# Patient Record
Sex: Female | Born: 1965 | Race: White | Hispanic: No | Marital: Married | Smoking: Never smoker
Health system: Southern US, Community
[De-identification: ages and names within clinical notes are randomized; demographics above are authoritative.]

## PROBLEM LIST (undated history)

## (undated) DIAGNOSIS — F32A Depression, unspecified: Secondary | ICD-10-CM

## (undated) DIAGNOSIS — F329 Major depressive disorder, single episode, unspecified: Secondary | ICD-10-CM

## (undated) HISTORY — PX: ABDOMINAL HYSTERECTOMY: SHX81

---

## 2014-05-15 ENCOUNTER — Emergency Department (HOSPITAL_BASED_OUTPATIENT_CLINIC_OR_DEPARTMENT_OTHER)
Admission: EM | Admit: 2014-05-15 | Discharge: 2014-05-16 | Disposition: A | Discharge status: Home / Self Care | Payer: 59 | Attending: Emergency Medicine | Admitting: Emergency Medicine

## 2014-05-15 ENCOUNTER — Encounter (HOSPITAL_BASED_OUTPATIENT_CLINIC_OR_DEPARTMENT_OTHER): Payer: Self-pay | Admitting: *Deleted

## 2014-05-15 ENCOUNTER — Emergency Department (HOSPITAL_BASED_OUTPATIENT_CLINIC_OR_DEPARTMENT_OTHER): Payer: 59

## 2014-05-15 DIAGNOSIS — R05 Cough: Secondary | ICD-10-CM | POA: Diagnosis present

## 2014-05-15 DIAGNOSIS — Z7952 Long term (current) use of systemic steroids: Secondary | ICD-10-CM | POA: Diagnosis not present

## 2014-05-15 DIAGNOSIS — J209 Acute bronchitis, unspecified: Secondary | ICD-10-CM | POA: Diagnosis not present

## 2014-05-15 DIAGNOSIS — Z8659 Personal history of other mental and behavioral disorders: Secondary | ICD-10-CM | POA: Insufficient documentation

## 2014-05-15 DIAGNOSIS — R059 Cough, unspecified: Secondary | ICD-10-CM

## 2014-05-15 DIAGNOSIS — Z79899 Other long term (current) drug therapy: Secondary | ICD-10-CM | POA: Diagnosis not present

## 2014-05-15 DIAGNOSIS — Z792 Long term (current) use of antibiotics: Secondary | ICD-10-CM | POA: Insufficient documentation

## 2014-05-15 HISTORY — DX: Major depressive disorder, single episode, unspecified: F32.9

## 2014-05-15 HISTORY — DX: Depression, unspecified: F32.A

## 2014-05-15 MED ORDER — AEROCHAMBER PLUS W/MASK MISC
1.0000 | Freq: Once | Status: AC
  Administered 2014-05-16: 1
  Filled 2014-05-15: qty 1

## 2014-05-15 NOTE — Discharge Instructions (Signed)

## 2014-05-15 NOTE — ED Provider Notes (Signed)
CSN: [id number]     Arrival date & time 05/15/14  2119 History  This chart was scribed for Hanley Seamen, MD by Roxy Cedar, ED Scribe. This patient was seen in room MH05/MH05 and the patient's care was started at 11:52 PM.   Chief Complaint  Patient presents with  . Cough   HPI  HPI Comments: Robin Love is a 49 y.o. female who presents to the Emergency Department complaining of cough that began a week ago with associated soreness in chest when she coughs. She was seen by a physician 7 days ago, and again 5 days ago, and has been treated with cefdinir, hydrocodone/homatropine, Tessalon Perles, prednisone and an albuterol inhaler. The patient reports continued severe coughing despite use of these medications. Her chest wall pain, located in the bilateral anterior lower ribs, is worse. She denies a fever. No x-ray was done prior to her visit here.  Past Medical History  Diagnosis Date  . Depression    Past Surgical History  Procedure Laterality Date  . Abdominal hysterectomy    . Cesarean section     History reviewed. No pertinent family history. History  Substance Use Topics  . Smoking status: Never Smoker   . Smokeless tobacco: Not on file  . Alcohol Use: No   OB History    No data available     Review of Systems  A complete 10 system review of systems was obtained and all systems are negative except as noted in the HPI and PMH.    Allergies  Review of patient's allergies indicates no active allergies.  Home Medications   Prior to Admission medications   Medication Sig Start Date End Date Taking? Authorizing Provider  albuterol (PROVENTIL HFA;VENTOLIN HFA) 108 (90 BASE) MCG/ACT inhaler Inhale into the lungs every 6 (six) hours as needed for wheezing or shortness of breath.   Yes Historical Provider, MD  cefdinir (OMNICEF) 300 MG capsule Take 300 mg by mouth 2 (two) times daily.   Yes Historical Provider, MD  HYDROcodone-homatropine (HYCODAN) 5-1.5 MG/5ML syrup Take 5  mLs by mouth every 6 (six) hours as needed for cough.   Yes Historical Provider, MD  predniSONE (DELTASONE) 10 MG tablet Take 10 mg by mouth daily with breakfast.   Yes Historical Provider, MD   Triage Vitals: BP 137/89 mmHg  Pulse 80  Temp(Src) 98.6 F (37 C) (Oral)  Resp 20  SpO2 98%  Physical Exam  General: Well-developed, well-nourished female in no acute distress; appearance consistent with age of record HENT: normocephalic; atraumatic Eyes: pupils equal, round and reactive to light; extraocular muscles intact Neck: supple Heart: regular rate and rhythm Lungs: faint inspiratory wheezing and decreased sounds in bases; cough with attempted deep breathing Abdomen: soft; nondistended; nontender; bowel sounds present Extremities: No deformity; full range of motion; pulses normal Neurologic: Awake, alert and oriented; motor function intact in all extremities and symmetric; no facial droop Skin: Warm and dry Psychiatric: Normal mood and affect  ED Course  Procedures (including critical care time)  DIAGNOSTIC STUDIES: Oxygen Saturation is 98% on RA, normal by my interpretation.    COORDINATION OF CARE: 11:58 PM- Advised patient to continue taking present medications but will have Respiratory Therapist giver her a spacer and teach her how to use her albuterol inhaler optimally. Discussed plans to discharge. Pt advised of plan for treatment and pt agrees.  MDM  Nursing notes and vitals signs, including pulse oximetry, reviewed.  Summary of this visit's results, reviewed by myself:  Imaging Studies: Dg Chest 2 View  05/15/2014   CLINICAL DATA:  Cough for 1 week, recent diagnosis of bronchitis, not improving with medication.  EXAM: CHEST  2 VIEW  COMPARISON:  Chest radiograph report December 14, 2005 though images are not available for direct comparison.  FINDINGS: The cardiac silhouette is upper limits of normal in size, mediastinal silhouette is nonsuspicious. LEFT lower lobe bandlike  consolidation best appreciate on the lateral radiograph. No pleural effusion. No pneumothorax. Soft tissue planes and included osseous structures are nonsuspicious.  IMPRESSION: Bandlike consolidation in the LEFT lower lobe favors atelectasis though underlying pneumonia not excluded. Recommend follow-up radiographs to verify improvement after treatment.  Borderline cardiomegaly.   Electronically Signed   By: Awilda Metroourtnay  Bloomer   On: 05/15/2014 22:07    Final diagnoses:  Acute bronchitis with bronchospasm    I personally performed the services described in this documentation, which was scribed in my presence. The recorded information has been reviewed and is accurate.  Hanley SeamenJohn L Linette Gunderson, MD 05/16/14 [phone]

## 2014-05-15 NOTE — ED Notes (Signed)
C/o cough onset last week  Was seen Friday and Sunday for same,  Has been on several meds w no improvement  Having bilateral side and back pain from cough

## 2014-05-15 NOTE — ED Notes (Signed)
Pt c/o DX bronchitis x 1 week ago given ABX/prednsione  pt c/o bil flank and chest pain from coughing , no xray done.

## 2014-05-16 NOTE — Progress Notes (Signed)
Rt instructed the patient in the correct use of a spacer with her MDI.  Patient demonstrated correct technique using a MDI with a spacer.

## 2016-03-16 IMAGING — CR DG CHEST 2V
2 series · 2 of 2 positions shown · non-contrast
Comparison: Chest radiograph report December 14, 2005 though images
are not available for direct comparison.

CLINICAL DATA: Cough for 1 week, recent diagnosis of bronchitis,
not improving with medication.

EXAM:
CHEST  2 VIEW

[w chest pa]
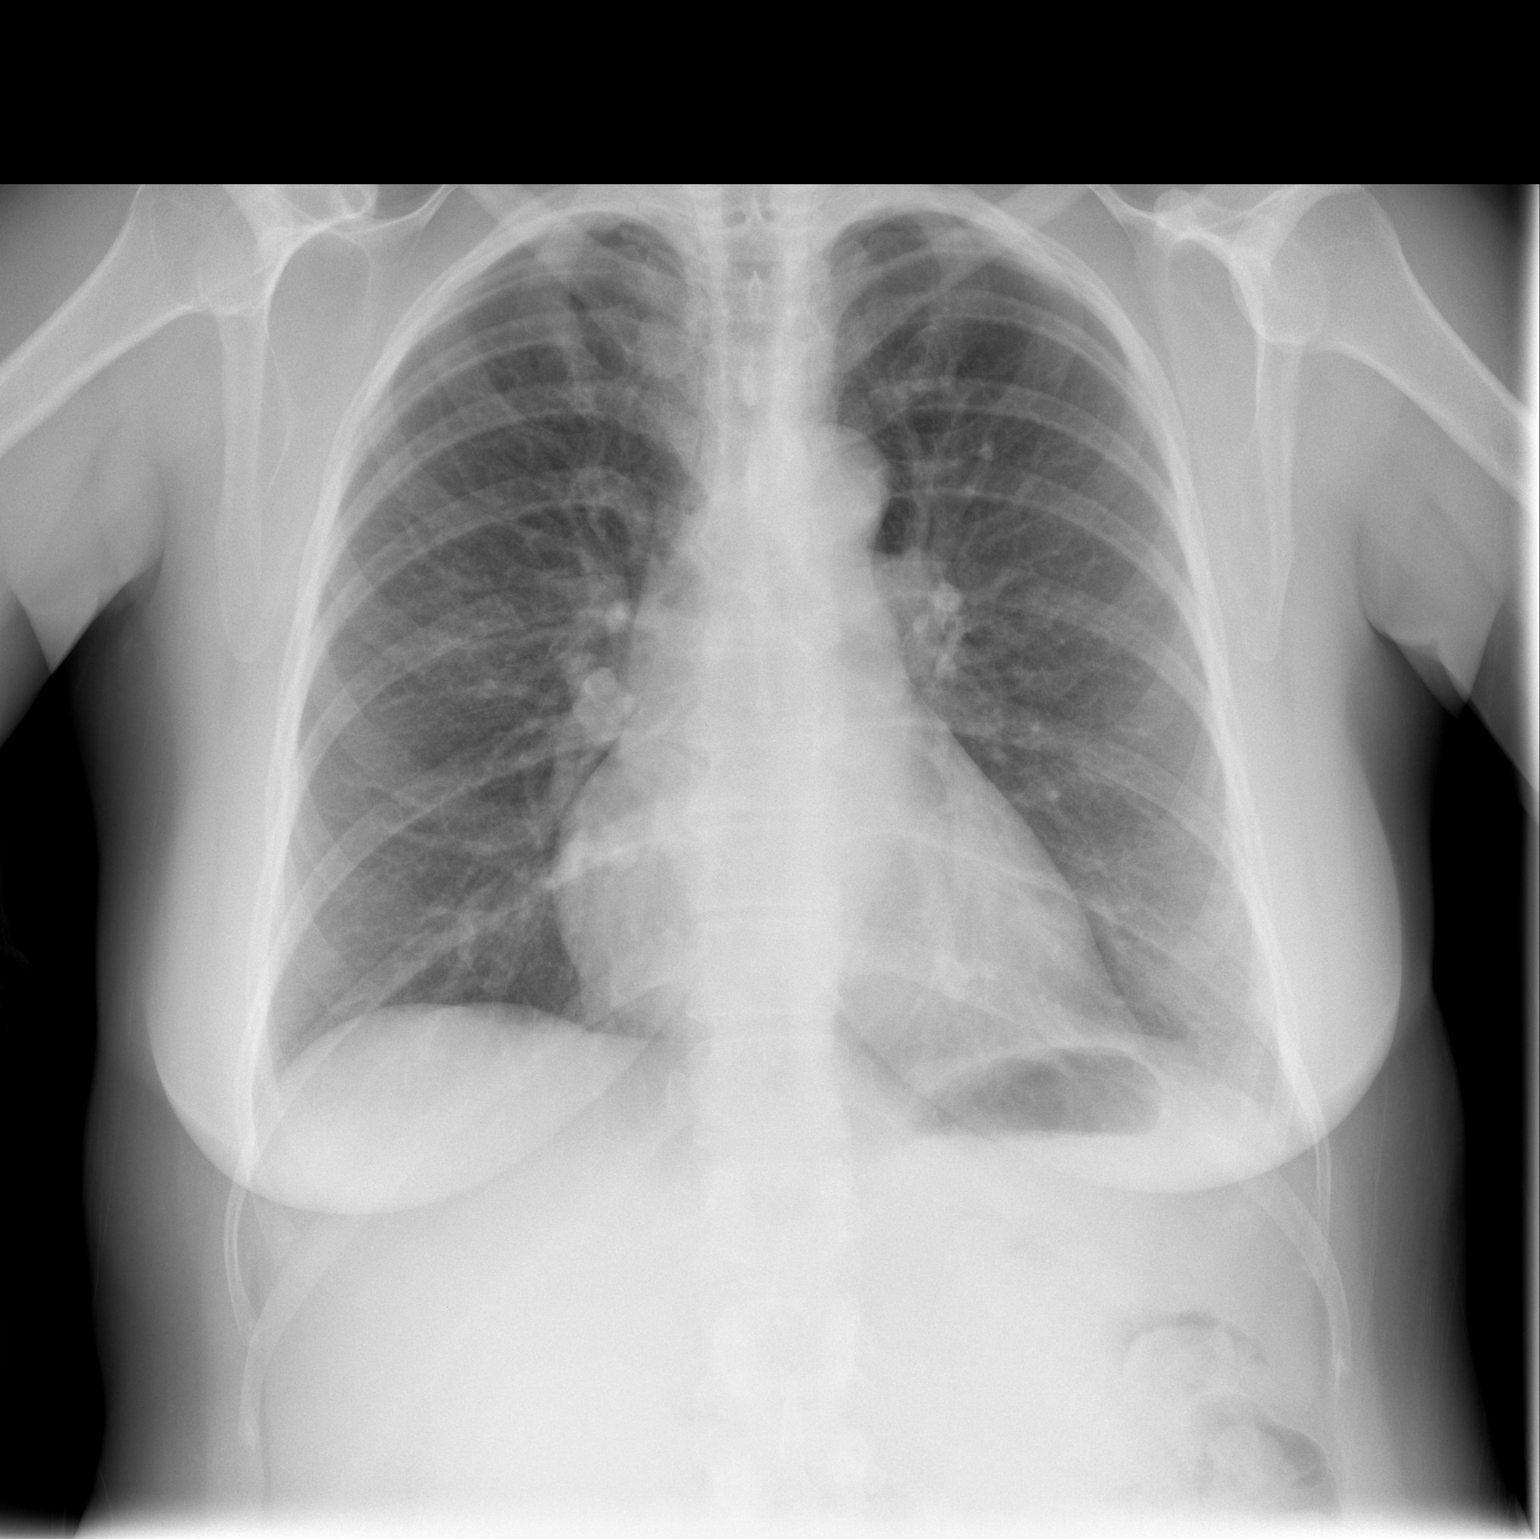

[w chest lat]
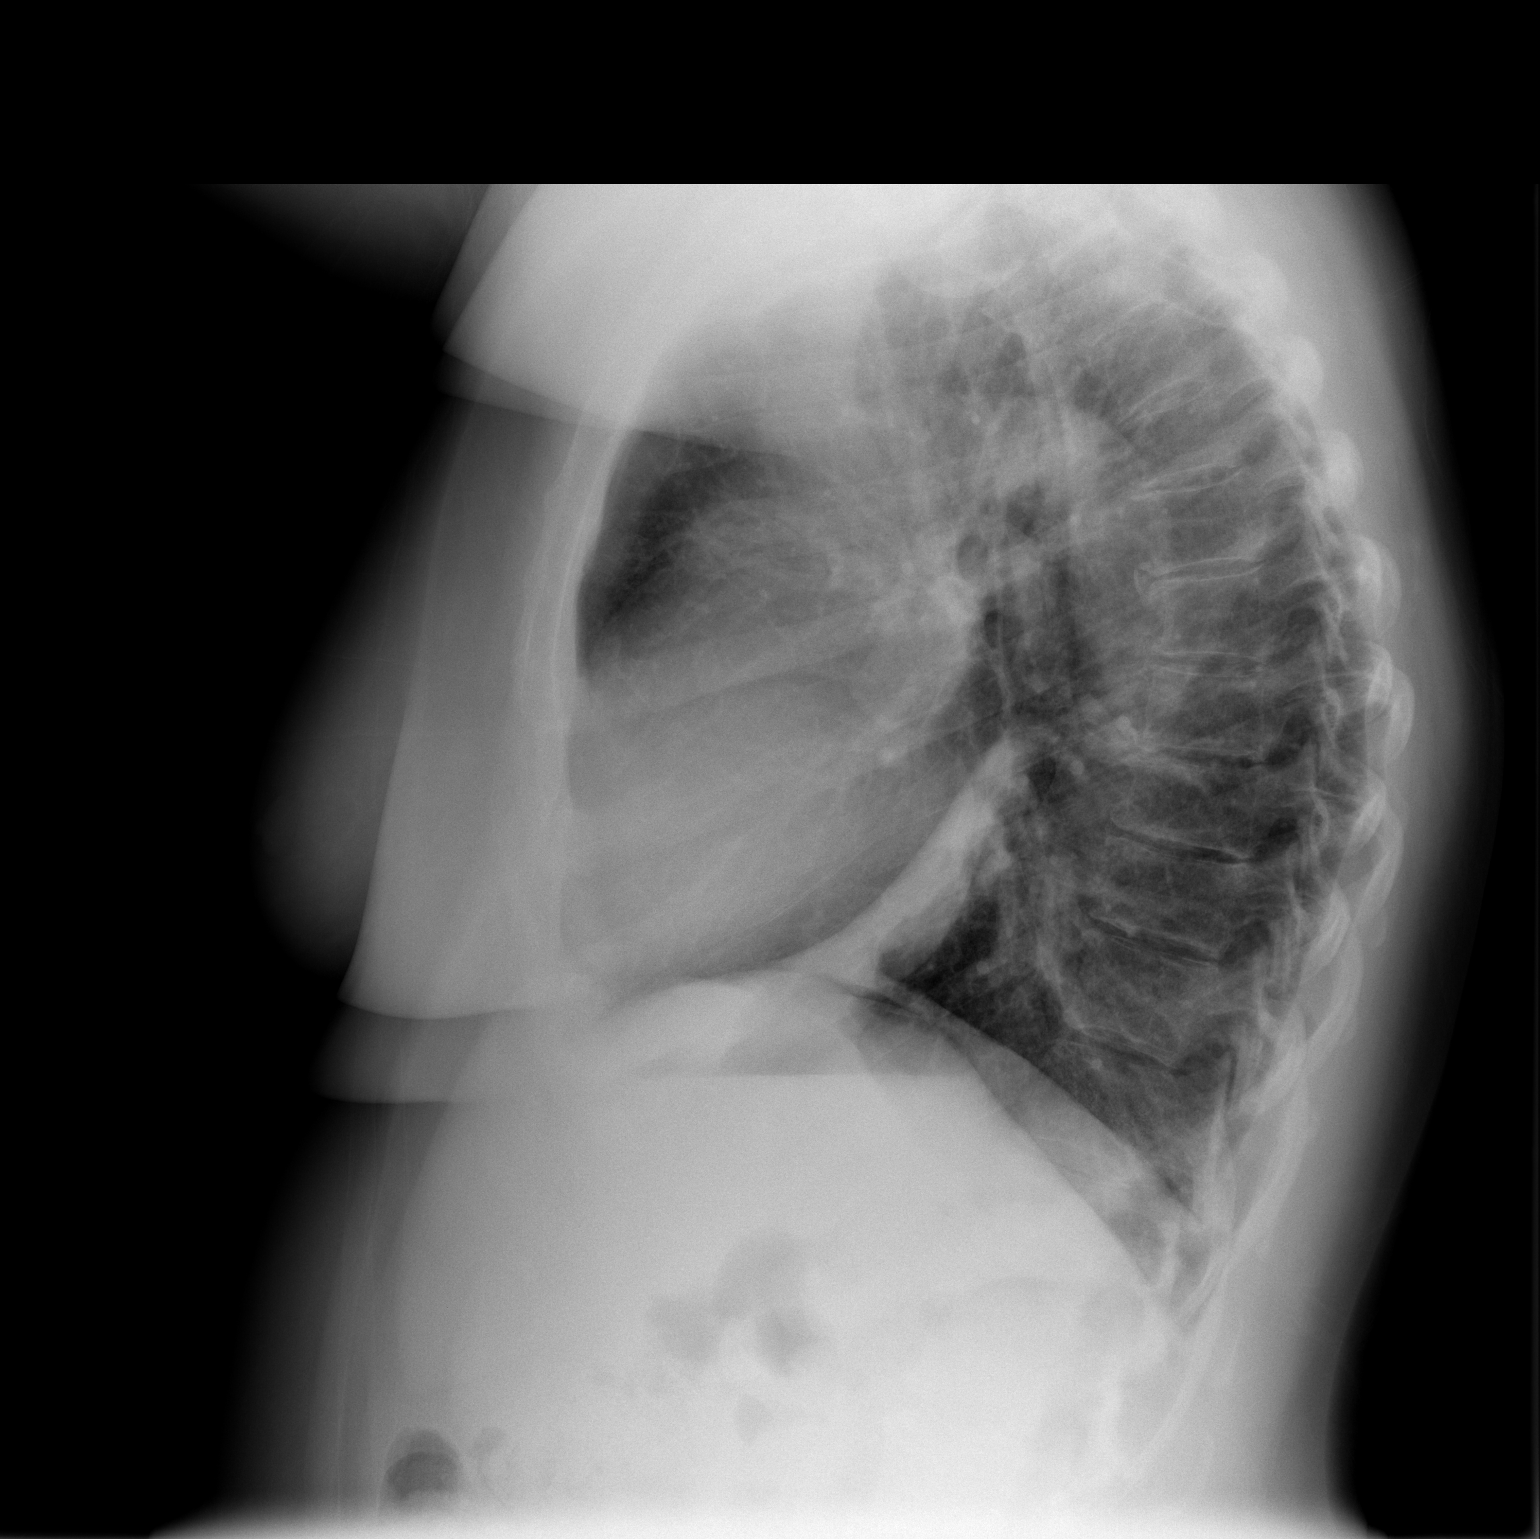

[2 of 2 positions shown; findings below may reference images not displayed]

FINDINGS: The cardiac silhouette is upper limits of normal in size,
mediastinal silhouette is nonsuspicious. LEFT lower lobe bandlike
consolidation best appreciate on the lateral radiograph. No pleural
effusion. No pneumothorax. Soft tissue planes and included osseous
structures are nonsuspicious.
IMPRESSION: Bandlike consolidation in the LEFT lower lobe favors atelectasis
though underlying pneumonia not excluded. Recommend follow-up
radiographs to verify improvement after treatment.

Borderline cardiomegaly.

  By: Donjeta Kosumi Germauc
